# Patient Record
Sex: Male | Born: 1975 | Hispanic: Yes | Smoking: Never smoker
Health system: Southern US, Community
[De-identification: ages and names within clinical notes are randomized; demographics above are authoritative.]

---

## 2013-02-07 ENCOUNTER — Ambulatory Visit: Payer: Self-pay

## 2013-02-07 ENCOUNTER — Ambulatory Visit: Payer: Self-pay | Admitting: Family Medicine

## 2013-02-07 VITALS — BP 114/74 | HR 60 | Temp 98.3°F | Resp 16 | Ht 69.5 in | Wt 185.4 lb

## 2013-02-07 DIAGNOSIS — M549 Dorsalgia, unspecified: Secondary | ICD-10-CM

## 2013-02-07 DIAGNOSIS — M79671 Pain in right foot: Secondary | ICD-10-CM

## 2013-02-07 DIAGNOSIS — M79609 Pain in unspecified limb: Secondary | ICD-10-CM

## 2013-02-07 MED ORDER — CYCLOBENZAPRINE HCL 5 MG PO TABS: ORAL_TABLET | ORAL | Status: DC

## 2013-02-07 MED ORDER — DICLOFENAC SODIUM 75 MG PO TBEC: DELAYED_RELEASE_TABLET | ORAL | Status: DC

## 2013-02-07 NOTE — Progress Notes (Signed)
Subjective: The patient was cutting a tree and the log fell on his right foot about 6 months ago. It is persisted in hurting across the arch of the foot. Initially there is bruising of the entire base of the foot. That has resolved but the pain is continued to bother him. He works doing Aeronautical engineer.  The patient has also had for longtime pain in his upper back across the base of the shoulders.  Objective: Healthy-appearing gentleman in no major acute distress. Stat tenderness across the upper back above the scapula. He also has tenderness in the arch of his right foot. Good pedal pulses. Good motion is normal. No tenderness of the calcaneus.  [facility] reading (PRIMARY) by  Dr. Alwyn Ren Normal foot.  Assessment: Overuse injury upper back strain Arch strain and pain right foot  Plan: X-ray was done Treat with anti-inflammatory and muscle relaxation medication

## 2013-02-07 NOTE — Patient Instructions (Addendum)
Take muscle relaxants (Flexeril) (cyclobenzaprine) one at bedtime  Take diclofenac for pain and inflammation one twice daily with food  Ice foot as needed for pain  Wear shoes inserts for arch supports  If symptoms persist we may need to refer you to an orthopedist or foot doctor for custom-made orthotic shoe insert

## 2013-03-27 ENCOUNTER — Emergency Department (HOSPITAL_COMMUNITY)
Admission: EM | Admit: 2013-03-27 | Discharge: 2013-03-27 | Disposition: A | Discharge status: Home / Self Care | Payer: Worker's Compensation | Attending: Emergency Medicine | Admitting: Emergency Medicine

## 2013-03-27 ENCOUNTER — Encounter (HOSPITAL_COMMUNITY): Payer: Self-pay | Admitting: Emergency Medicine

## 2013-03-27 ENCOUNTER — Emergency Department (HOSPITAL_COMMUNITY): Payer: Worker's Compensation

## 2013-03-27 DIAGNOSIS — Y9289 Other specified places as the place of occurrence of the external cause: Secondary | ICD-10-CM | POA: Insufficient documentation

## 2013-03-27 DIAGNOSIS — S61411A Laceration without foreign body of right hand, initial encounter: Secondary | ICD-10-CM

## 2013-03-27 DIAGNOSIS — Z23 Encounter for immunization: Secondary | ICD-10-CM | POA: Insufficient documentation

## 2013-03-27 DIAGNOSIS — W298XXA Contact with other powered powered hand tools and household machinery, initial encounter: Secondary | ICD-10-CM | POA: Insufficient documentation

## 2013-03-27 DIAGNOSIS — Y9389 Activity, other specified: Secondary | ICD-10-CM | POA: Insufficient documentation

## 2013-03-27 DIAGNOSIS — S61209A Unspecified open wound of unspecified finger without damage to nail, initial encounter: Secondary | ICD-10-CM | POA: Insufficient documentation

## 2013-03-27 DIAGNOSIS — S61409A Unspecified open wound of unspecified hand, initial encounter: Secondary | ICD-10-CM | POA: Insufficient documentation

## 2013-03-27 MED ORDER — CEPHALEXIN 500 MG PO CAPS: 500.0000 mg | ORAL_CAPSULE | Freq: Four times a day (QID) | ORAL | Status: DC

## 2013-03-27 MED ORDER — HYDROCODONE-ACETAMINOPHEN 5-325 MG PO TABS: 2.0000 | ORAL_TABLET | ORAL | Status: DC | PRN

## 2013-03-27 MED ORDER — TETANUS-DIPHTH-ACELL PERTUSSIS 5-2.5-18.5 LF-MCG/0.5 IM SUSP
0.5000 mL | Freq: Once | INTRAMUSCULAR | Status: AC
  Administered 2013-03-27: 0.5 mL via INTRAMUSCULAR
  Filled 2013-03-27: qty 0.5

## 2013-03-27 NOTE — ED Notes (Signed)
Pt in radiology at this time, will be transported to room upon completion

## 2013-03-27 NOTE — ED Notes (Signed)
Pt presents with multiple lacerations to R 3rd finger and 1 to R 5th finger.  Pt reports cutting fingers with chain saw while up in tree, was able to climb down.  Bleeding controlled.

## 2013-03-27 NOTE — ED Provider Notes (Signed)
CSN: [id number]     Arrival date & time 03/27/13  1441 History   First MD Initiated Contact with Patient 03/27/13 1606     No chief complaint on file.  (Consider location/radiation/quality/duration/timing/severity/associated sxs/prior Treatment) The history is provided by the patient and medical records. No language interpreter was used.    Adam Park is a 37 y.o. male  with no known medical history presents to the Emergency Department complaining of acute, persistent, laceration to the right hand onset 3 PM today. Patient reports he was at work in a tree using a chain saw which slipped and cut his right hand. He reports he did not wash it or attempt any treatment prior to arrival. Nothing makes it better or worse. He denies fever, chills. He denies falling or hitting his head. He denies loss of consciousness. Patient reports that he was able to climb down from the tree after the laceration occurred.    History reviewed. No pertinent past medical history. History reviewed. No pertinent past surgical history. Family History  Problem Relation Age of Onset  . Diabetes Mother   . Drug abuse Paternal Grandmother    History  Substance Use Topics  . Smoking status: Never Smoker   . Smokeless tobacco: Not on file  . Alcohol Use: No    Review of Systems  Constitutional: Negative for fever.  Gastrointestinal: Negative for nausea and vomiting.  Skin: Positive for wound.  Allergic/Immunologic: Negative for immunocompromised state.  Neurological: Negative for weakness and numbness.  Hematological: Does not bruise/bleed easily.  Psychiatric/Behavioral: The patient is not nervous/anxious.     Allergies  Review of patient's allergies indicates no known allergies.  Home Medications   Current Outpatient Rx  Name  Route  Sig  Dispense  Refill  . cephALEXin (KEFLEX) 500 MG capsule   Oral   Take 1 capsule (500 mg total) by mouth 4 (four) times daily.   40 capsule   0   .  HYDROcodone-acetaminophen (NORCO/VICODIN) 5-325 MG per tablet   Oral   Take 2 tablets by mouth every 4 (four) hours as needed for pain.   6 tablet   0    BP 134/80  Pulse 82  Temp(Src) 98 F (36.7 C) (Oral)  Resp 20  SpO2 100% Physical Exam  Nursing note and vitals reviewed. Constitutional: He is oriented to person, place, and time. He appears well-developed and well-nourished. No distress.  HENT:  Head: Normocephalic and atraumatic.  Eyes: Conjunctivae are normal. No scleral icterus.  Neck: Normal range of motion.  Cardiovascular: Normal rate, regular rhythm, normal heart sounds and intact distal pulses.   No murmur heard. Pulses:      Radial pulses are 2+ on the right side, and 2+ on the left side.  Capillary refill < 3 sec  Pulmonary/Chest: Effort normal and breath sounds normal. No respiratory distress.  Musculoskeletal: Normal range of motion. He exhibits no edema.  ROM: Full range of motion of all fingers on the right hand  Neurological: He is alert and oriented to person, place, and time.  Sensation: Intact to dull and sharp in all fingers and the right hand Strength: 5 out of 5 in the right hand including resisted flexion and extension of the fingers as well as strong grip strength  Skin: Skin is warm and dry. He is not diaphoretic.  Multiple lacerations to the right middle finger and right pinky finger  Psychiatric: He has a normal mood and affect.    ED Course  LACERATION REPAIR Date/Time: 03/27/2013 6:00 PM Performed by: Dierdre Forth Authorized by: Dierdre Forth Consent: Verbal consent obtained. Risks and benefits: risks, benefits and alternatives were discussed Consent given by: patient Patient understanding: patient states understanding of the procedure being performed Patient consent: the patient's understanding of the procedure matches consent given Procedure consent: procedure consent matches procedure scheduled Relevant documents:  relevant documents present and verified Required items: required blood products, implants, devices, and special equipment available Patient identity confirmed: verbally with patient and arm band Time out: Immediately prior to procedure a "time out" was called to verify the correct patient, procedure, equipment, support staff and site/side marked as required. Body area: upper extremity Location details: right long finger Laceration length: 6 cm Foreign bodies: no foreign bodies Tendon involvement: none Nerve involvement: none Vascular damage: no Anesthesia: digital block Local anesthetic: lidocaine 2% without epinephrine Anesthetic total: 5 ml Patient sedated: no Preparation: Patient was prepped and draped in the usual sterile fashion. Irrigation solution: saline (Iodine scrub solution used) Irrigation method: syringe Amount of cleaning: extensive Debridement: moderate Degree of undermining: none Wound skin closure material used: 5-0 Vicryl rapide. Number of sutures: 9 Technique: simple Approximation: loose Approximation difficulty: complex Dressing: 4x4 sterile gauze Patient tolerance: Patient tolerated the procedure well with no immediate complications. Comments: Multiple laceration repairs of the right long finger all with 5-0 Vicryl rapide:  6 cm, L. shaped laceration with 9 simple interrupted sutures placed 2.5 cm laceration with 2 simple interrupted sutures placed 5 cm U shaped laceration with 6 sutures placed 2 cm V-shaped laceration with 2 sutures placed 2 cm laceration on the distal aspect of the finger with 2 sutures placed  LACERATION REPAIR Date/Time: 03/27/2013 6:02 PM Performed by: Dierdre Forth Authorized by: Dierdre Forth Consent: Verbal consent obtained. Risks and benefits: risks, benefits and alternatives were discussed Consent given by: patient Patient understanding: patient states understanding of the procedure being performed Patient  consent: the patient's understanding of the procedure matches consent given Procedure consent: procedure consent matches procedure scheduled Relevant documents: relevant documents present and verified Site marked: the operative site was marked Imaging studies: imaging studies available Required items: required blood products, implants, devices, and special equipment available Patient identity confirmed: verbally with patient and arm band Time out: Immediately prior to procedure a "time out" was called to verify the correct patient, procedure, equipment, support staff and site/side marked as required. Body area: upper extremity Location details: right small finger Laceration length: 3 cm Foreign bodies: no foreign bodies Tendon involvement: none Nerve involvement: none Vascular damage: no Anesthesia: digital block Local anesthetic: lidocaine 2% without epinephrine Anesthetic total: 5 ml Patient sedated: no Preparation: Patient was prepped and draped in the usual sterile fashion (Iodine scrub solution used). Irrigation solution: saline Irrigation method: syringe Amount of cleaning: extensive Debridement: moderate Wound skin closure material used: 5-0 Vicryl rapide. Number of sutures: 3 Technique: simple Approximation: loose Approximation difficulty: complex Dressing: 4x4 sterile gauze Patient tolerance: Patient tolerated the procedure well with no immediate complications.   (including critical care time) Labs Review Labs Reviewed - No data to display Imaging Review Dg Hand Complete Right  03/27/2013   CLINICAL DATA:  Hand pain and numbness. Cut with a chain sella.  EXAM: RIGHT HAND - COMPLETE 3+ VIEW  FINDINGS: The joint spaces are maintained. No acute fractures identified. No radiopaque foreign body.  IMPRESSION: No acute fracture or radiopaque foreign body.   Electronically Signed   By: Loralie Champagne M.D.   On: 03/27/2013 15:51  EKG Interpretation   None       MDM    1. Laceration of hand, right, complicated, initial encounter     Bartlett Enke presents with multiple jagged lacerations to the middle and little fingers of the right hand from a chain saw accident. Unknown last tetanus.  6:04 PM Greater than one hour spent in suturing the patient's lacerations closed.  Tdap booster given. Pressure irrigation performed and the patient's hand scrubbed with iodine scrub solution prior to debridement and closure. Laceration occurred < 8 hours prior to repair which was well tolerated. Pt has no co morbidities to effect normal wound healing. Patient neurovascularly intact without evidence of injury to any tendons. Discussed suture home care w pt and answered questions. Pt to f-u for wound check and suture removal in 7 days with hand surgery. Pt is hemodynamically stable w no complaints prior to dc.    It has been determined that no acute conditions requiring further emergency intervention are present at this time. The patient/guardian have been advised of the diagnosis and plan. We have discussed signs and symptoms that warrant return to the ED, such as changes or worsening in symptoms.   Vital signs are stable at discharge.   BP 134/80  Pulse 82  Temp(Src) 98 F (36.7 C) (Oral)  Resp 20  SpO2 100%  Patient/guardian has voiced understanding and agreed to follow-up with the PCP or specialist.    I personally performed the services described in this documentation, which was scribed in my presence. The recorded information has been reviewed and is accurate.    Dahlia Client Denzil Bristol, PA-C 03/27/13 2247

## 2013-03-28 NOTE — ED Provider Notes (Signed)
Medical screening examination/treatment/procedure(s) were performed by non-physician practitioner and as supervising physician I was immediately available for consultation/collaboration.  Matylda Fehring L Linnie Mcglocklin, MD 03/28/13 0022 

## 2015-03-07 IMAGING — CR DG HAND COMPLETE 3+V*R*
4 series · 4 of 4 positions shown · non-contrast
Comparison: none

CLINICAL DATA: Hand pain and numbness. Cut with a chain sella.

EXAM:
RIGHT HAND - COMPLETE 3+ VIEW

[x hand pa right (1 of 2)]
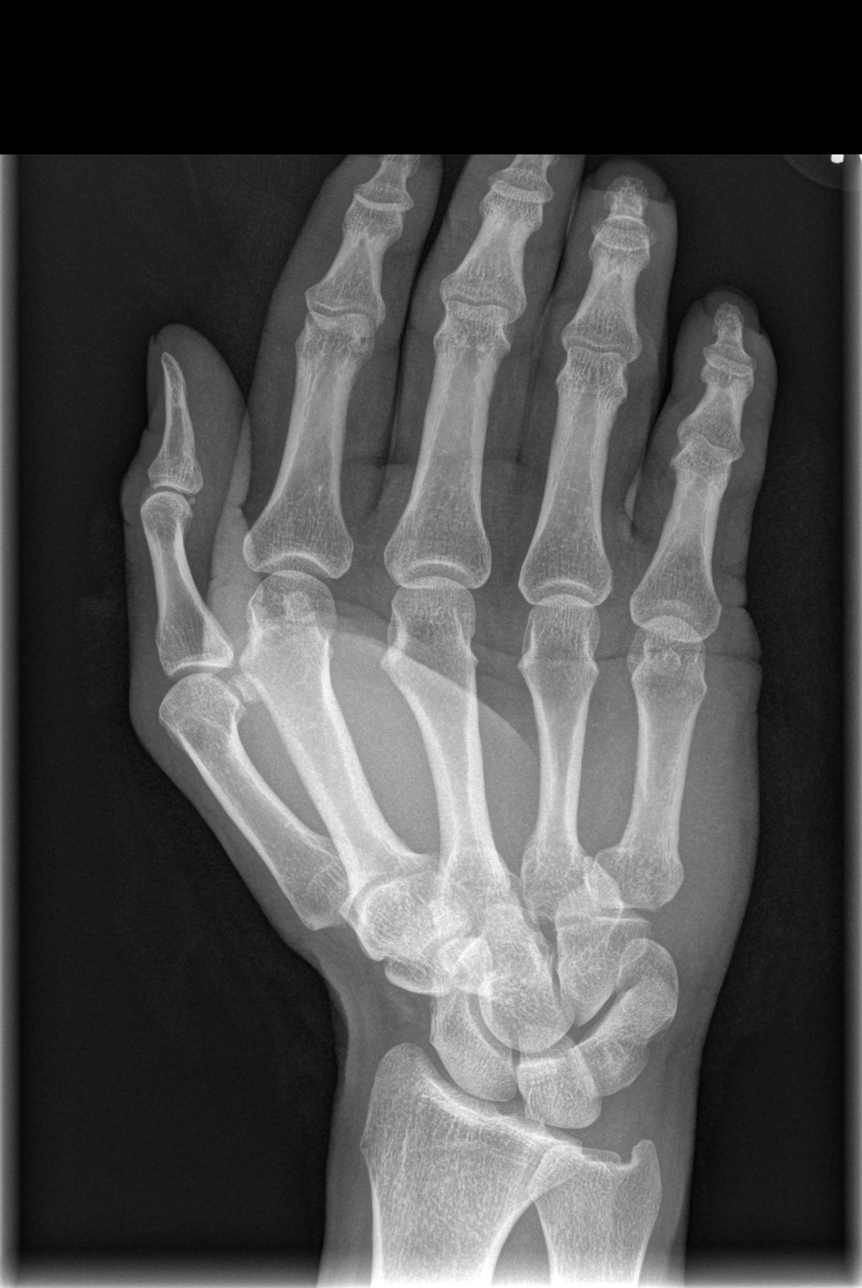

[x hand oblique right]
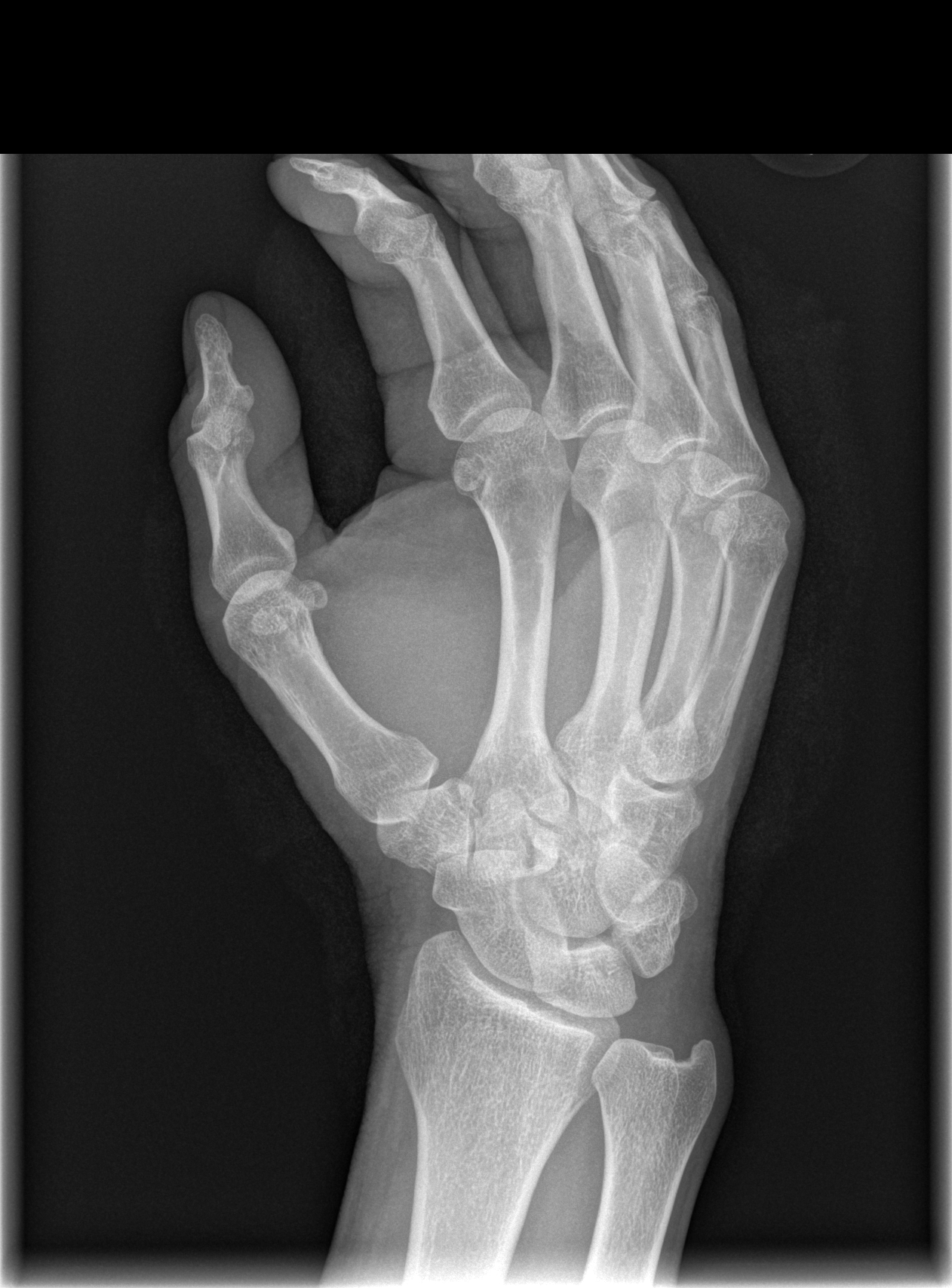

[x hand lat right]
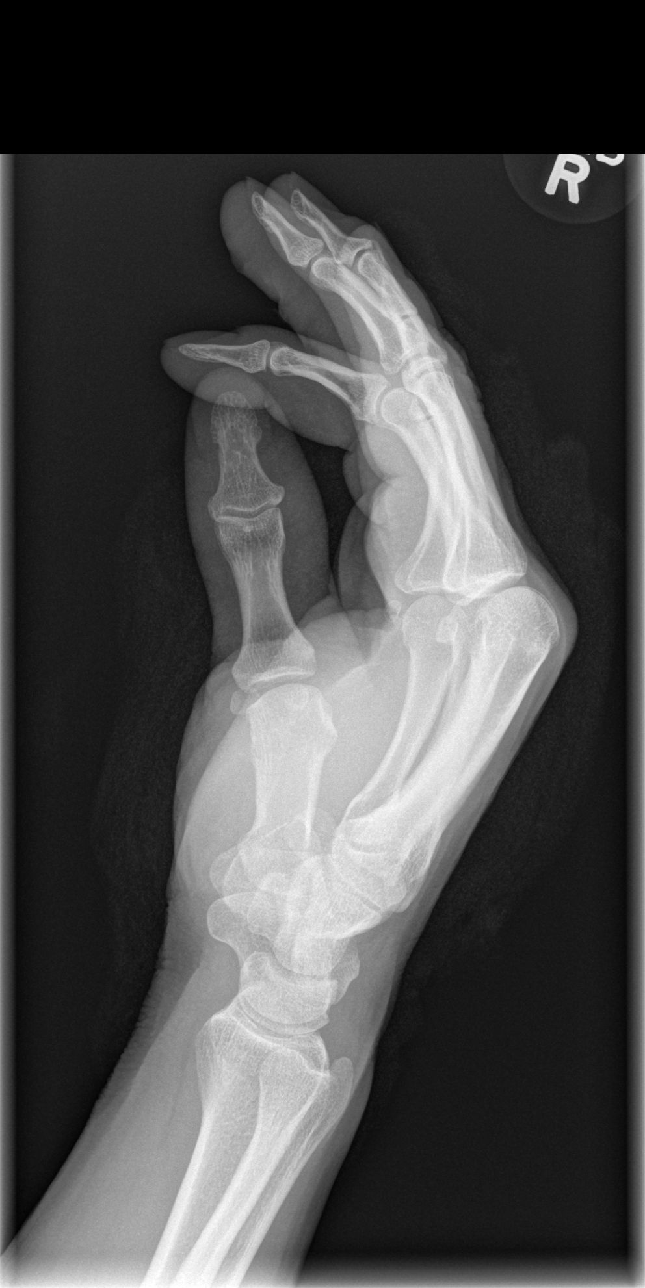

[x hand pa right (2 of 2)]
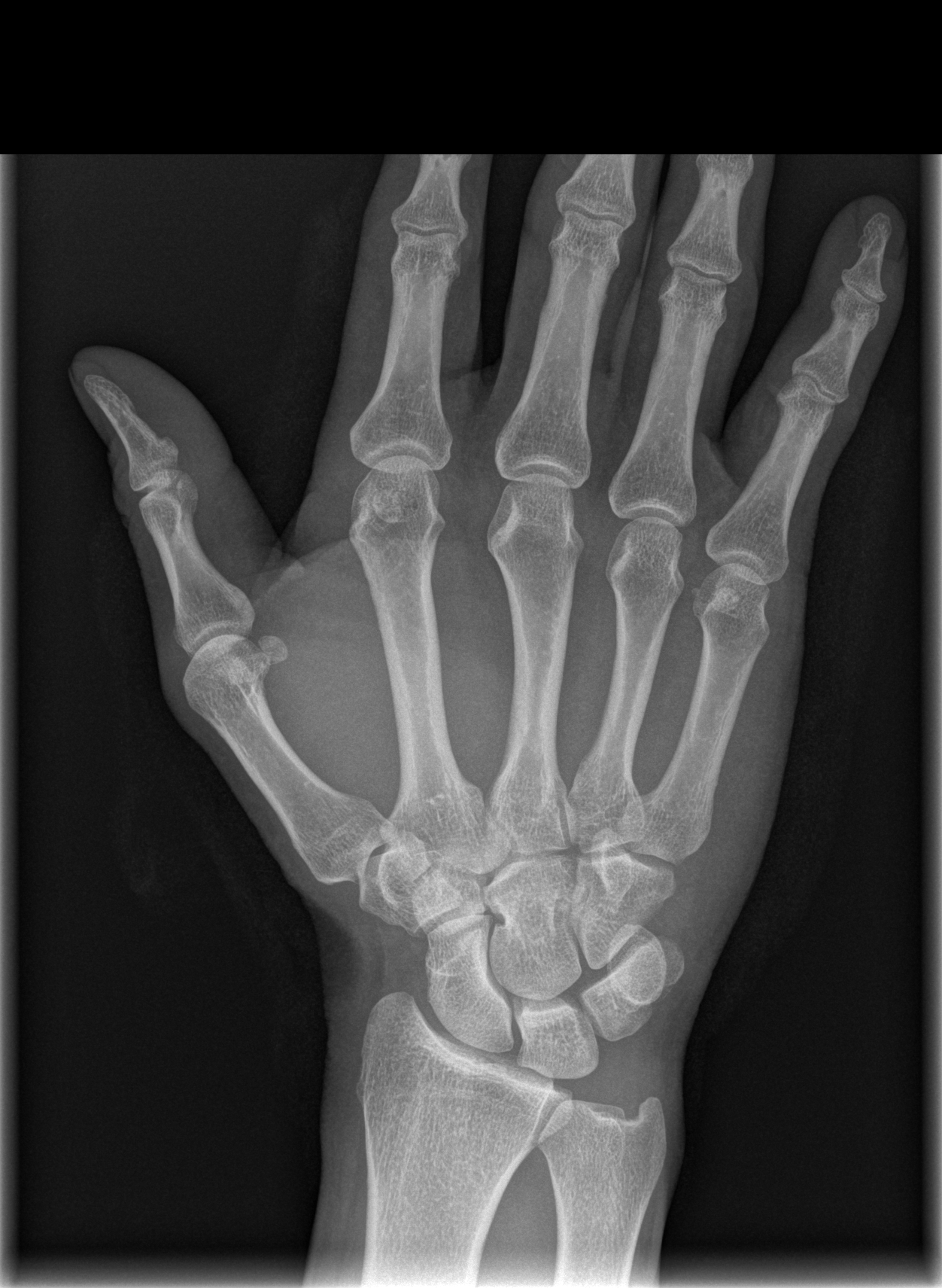

[4 of 4 positions shown; findings below may reference images not displayed]

FINDINGS: The joint spaces are maintained. No acute fractures identified. No
radiopaque foreign body.
IMPRESSION: No acute fracture or radiopaque foreign body.

## 2016-02-08 ENCOUNTER — Ambulatory Visit (INDEPENDENT_AMBULATORY_CARE_PROVIDER_SITE_OTHER): Payer: Self-pay | Admitting: Family Medicine

## 2016-02-08 VITALS — BP 118/80 | HR 53 | Temp 98.2°F | Resp 16 | Ht 71.0 in | Wt 182.0 lb

## 2016-02-08 DIAGNOSIS — K148 Other diseases of tongue: Secondary | ICD-10-CM

## 2016-02-08 DIAGNOSIS — J329 Chronic sinusitis, unspecified: Secondary | ICD-10-CM

## 2016-02-08 DIAGNOSIS — Z0000 Encounter for general adult medical examination without abnormal findings: Secondary | ICD-10-CM

## 2016-02-08 DIAGNOSIS — Z23 Encounter for immunization: Secondary | ICD-10-CM

## 2016-02-08 DIAGNOSIS — R0982 Postnasal drip: Secondary | ICD-10-CM

## 2016-02-08 LAB — COMPLETE METABOLIC PANEL WITH GFR
ALT: 14 U/L (ref 9–46)
AST: 15 U/L (ref 10–40)
Albumin: 4.3 g/dL (ref 3.6–5.1)
Alkaline Phosphatase: 37 U/L — ABNORMAL LOW (ref 40–115)
BUN: 9 mg/dL (ref 7–25)
CO2: 27 mmol/L (ref 20–31)
Calcium: 9.2 mg/dL (ref 8.6–10.3)
Chloride: 104 mmol/L (ref 98–110)
Creat: 0.72 mg/dL (ref 0.60–1.35)
GFR, Est African American: >89 mL/min (ref >=60)
GFR, Est Non African American: >89 mL/min (ref >=60)
Glucose, Bld: 92 mg/dL (ref 65–99)
Potassium: 4.3 mmol/L (ref 3.5–5.3)
Sodium: 139 mmol/L (ref 135–146)
Total Bilirubin: 1.5 mg/dL — ABNORMAL HIGH (ref 0.2–1.2)
Total Protein: 7.1 g/dL (ref 6.1–8.1)

## 2016-02-08 LAB — CBC WITH DIFFERENTIAL/PLATELET
Basophils Absolute: 48 cells/uL (ref 0–200)
Basophils Relative: 1 %
Eosinophils Absolute: 144 cells/uL (ref 15–500)
Eosinophils Relative: 3 %
HCT: 46.1 % (ref 38.5–50.0)
Hemoglobin: 16.1 g/dL (ref 13.2–17.1)
Lymphocytes Relative: 34 %
Lymphs Abs: 1632 cells/uL (ref 850–3900)
MCH: 31.7 pg (ref 27.0–33.0)
MCHC: 34.9 g/dL (ref 32.0–36.0)
MCV: 90.7 fL (ref 80.0–100.0)
MPV: 10.9 fL (ref 7.5–12.5)
Monocytes Absolute: 336 cells/uL (ref 200–950)
Monocytes Relative: 7 %
Neutro Abs: 2640 cells/uL (ref 1500–7800)
Neutrophils Relative %: 55 %
Platelets: 226 10*3/uL (ref 140–400)
RBC: 5.08 MIL/uL (ref 4.20–5.80)
RDW: 12.6 % (ref 11.0–15.0)
WBC: 4.8 10*3/uL (ref 3.8–10.8)

## 2016-02-08 LAB — POCT URINALYSIS DIP (MANUAL ENTRY)
Bilirubin, UA: NEGATIVE
Blood, UA: NEGATIVE
Glucose, UA: NEGATIVE
Ketones, POC UA: NEGATIVE
Leukocytes, UA: NEGATIVE
Nitrite, UA: NEGATIVE
Protein Ur, POC: NEGATIVE
Spec Grav, UA: 1.015
Urobilinogen, UA: 0.2
pH, UA: 7.5

## 2016-02-08 LAB — LIPID PANEL
Cholesterol: 166 mg/dL (ref 125–200)
HDL: 46 mg/dL (ref >=40)
LDL Cholesterol: 100 mg/dL (ref <130)
Total CHOL/HDL Ratio: 3.6 Ratio (ref <=5.0)
Triglycerides: 100 mg/dL (ref <150)
VLDL: 20 mg/dL (ref <30)

## 2016-02-08 LAB — POC MICROSCOPIC URINALYSIS (UMFC): Mucus: ABSENT

## 2016-02-08 NOTE — Progress Notes (Signed)
   Patient ID: Adam Park, male    DOB: [birth year], 40 y.o.   MRN: [id number]  PCP: No primary care provider on file.  Chief Complaint  Patient presents with  . Annual Exam    administive pe    Subjective:   HPI   [phone] Adam EisenmengerFelix- Spanish  40 year old male presents for a physical exam. Patient is a non-smoker, married, father of 4 boys.   He reports overall good health and works Audiological scientistfull-time laborer.  HEENT Denies headaches. Reports recent nasal drainage over the last few days. He thinks he may have some seasonal allergies. No cough, sore throat ,or difficulty swallowing. He reports never having an eye exam and denies recent changes in vision. Reports two lesions under the tongue times 1 week. Non-painful. Never received a dental exam.  Respiratory/Cardiovascular  Denies chest pain, shortness of breath, wheezing, or palpitations. No known history of blood pressure or heart problems.  Abdomen No abdominal pain or pressure.  No problems with urinary or bowel problems. No muscular. No visual.  Eats three meals per day. High in carbohydrates.  Genitourinary    Neurological/Psychiatric   . Social History   Social History  . Marital status: Unknown    Spouse name: N/A  . Number of children: N/A  . Years of education: N/A   Occupational History  . Not on file.   Social History Main Topics  . Smoking status: Never Smoker  . Smokeless tobacco: Not on file  . Alcohol use No  . Drug use: No  . Sexual activity: Yes   Other Topics Concern  . Not on file   Social History Narrative  . No narrative on file    . Family History  Problem Relation Age of Onset  . Diabetes Mother   . Drug abuse Paternal Grandmother     Review of Systems See HPI   There are no active problems to display for this patient.    Prior to Admission medications   Not on File     No Known Allergies     Objective:  Physical Exam     . Vitals:   02/08/16 0839  BP:  118/80  Pulse: (!) 53  Resp: 16  Temp: 98.2 F (36.8 C)     Assessment & Plan:  Interpreter used to explain discharge Adam Park Adam Park   1. Annual physical exam Plan: - COMPLETE METABOLIC PANEL WITH GFR - Lipid panel - CBC with Differential/Platelet - Flu Vaccine QUAD 36+ mos IM - POCT urinalysis dipstick - POCT Microscopic Urinalysis ([facility]) Age appropriate anticipatory guidance provided  2. Post-nasal drainage, likely related to environmental allergens Plan: Take over the counter Cetrizine (Zyrtec 10 mg ) daily as needed until symptoms resolve.   3. Lesion of tongue  Follow with ENT skin under tongue. Or denist  Adam PickKimberly S. Adam PeaHarris, MSN, FNP-C [facility]

## 2016-02-08 NOTE — Patient Instructions (Addendum)
Follow-up for annual exam 1 year.  I will contact you regarding you labs results.  If the tongue lesions increase in size, return for care.   IF you received an x-ray today, you will receive an invoice from [facility]. Please contact [facility] at [phone] with questions or concerns regarding your invoice.   IF you received labwork today, you will receive an invoice from [facility]. Please contact [facility] at [phone] with questions or concerns regarding your invoice.   Our billing staff will not be able to assist you with questions regarding bills from these companies.  You will be contacted with the lab results as soon as they are available. The fastest way to get your results is to activate your My Chart account. Instructions are located on the last page of this paperwork. If you have not heard from us regarding the results in 2 weeks, please contact this office.

## 2016-02-09 ENCOUNTER — Encounter: Payer: Self-pay | Admitting: Family Medicine

## 2016-02-09 NOTE — Progress Notes (Signed)
February 09, 2016   Adam Park [address] [place] [address]   Dear [name],  Below are the results from your recent visit were as expected.  Please return in 12 months for annual physical exam.  Resulted Orders  COMPLETE METABOLIC PANEL WITH GFR  Result Value Ref Range   Sodium 139 135 - 146 mmol/L   Potassium 4.3 3.5 - 5.3 mmol/L   Chloride 104 98 - 110 mmol/L   CO2 27 20 - 31 mmol/L   Glucose, Bld 92 65 - 99 mg/dL   BUN 9 7 - 25 mg/dL   Creat 0.72 0.60 - 1.35 mg/dL   Total Bilirubin 1.5 (H) 0.2 - 1.2 mg/dL   Alkaline Phosphatase 37 (L) 40 - 115 U/L   AST 15 10 - 40 U/L   ALT 14 9 - 46 U/L   Total Protein 7.1 6.1 - 8.1 g/dL   Albumin 4.3 3.6 - 5.1 g/dL   Calcium 9.2 8.6 - 10.3 mg/dL   GFR, Est African American >89 >=60 mL/min   GFR, Est Non African American >89 >=60 mL/min   Narrative   Performed at:  [facility], [address]                [place], [place] [address]  Lipid panel  Result Value Ref Range   Cholesterol 166 125 - 200 mg/dL   Triglycerides 100 <150 mg/dL   HDL 46 >=40 mg/dL   Total CHOL/HDL Ratio 3.6 <=5.0 Ratio   VLDL 20 <30 mg/dL   LDL Cholesterol 100 <130 mg/dL     Comment:       Total Cholesterol/HDL Ratio:CHD Risk                        Coronary Heart Disease Risk Table                                        Men       Women          1/2 Average Risk              3.4        3.3              Average Risk              5.0        4.4           2X Average Risk              9.6        7.1           3X Average Risk             23.4       11.0 Use the calculated Patient Ratio above and the CHD Risk table  to determine the patient's CHD Risk.    Narrative   Performed at:  [address]                [place], [place] [address]  CBC with Differential/Platelet  Result Value Ref Range   WBC 4.8 3.8 -  10.8 K/uL   RBC 5.08 4.20 - 5.80 MIL/uL   Hemoglobin 16.1 13.2 - 17.1 g/dL   HCT 46.1 38.5 - 50.0 %   MCV 90.7 80.0 - 100.0 fL   MCH 31.7 27.0 - 33.0 pg   MCHC 34.9 32.0 - 36.0 g/dL   RDW 12.6 11.0 - 15.0 %   Platelets 226 140 - 400 K/uL   MPV 10.9 7.5 - 12.5 fL   Neutro Abs 2,640 1,500 - 7,800 cells/uL   Lymphs Abs 1,632 850 - 3,900 cells/uL   Monocytes Absolute 336 200 - 950 cells/uL   Eosinophils Absolute 144 15 - 500 cells/uL   Basophils Absolute 48 0 - 200 cells/uL   Neutrophils Relative % 55 %   Lymphocytes Relative 34 %   Monocytes Relative 7 %   Eosinophils Relative 3 %   Basophils Relative 1 %   Smear Review Criteria for review not met    Narrative   Performed at:  [address]                [place], [place] [address]     If you have any questions or concerns, please don't hesitate to call.  Sincerely,   Molli Barrows, FNP

## 2022-08-21 ENCOUNTER — Ambulatory Visit
Admission: EM | Admit: 2022-08-21 | Discharge: 2022-08-21 | Disposition: A | Discharge status: Home / Self Care | Payer: Self-pay | Attending: Physician Assistant | Admitting: Physician Assistant

## 2022-08-21 ENCOUNTER — Ambulatory Visit (INDEPENDENT_AMBULATORY_CARE_PROVIDER_SITE_OTHER): Payer: Self-pay

## 2022-08-21 DIAGNOSIS — M5432 Sciatica, left side: Secondary | ICD-10-CM

## 2022-08-21 DIAGNOSIS — M79662 Pain in left lower leg: Secondary | ICD-10-CM

## 2022-08-21 MED ORDER — DICLOFENAC SODIUM 75 MG PO TBEC: 75.0000 mg | DELAYED_RELEASE_TABLET | Freq: Two times a day (BID) | ORAL | 0 refills | Status: AC

## 2022-08-21 MED ORDER — METHYLPREDNISOLONE ACETATE 80 MG/ML IJ SUSP
80.0000 mg | Freq: Once | INTRAMUSCULAR | Status: AC
  Administered 2022-08-21: 80 mg via INTRAMUSCULAR

## 2022-08-21 MED ORDER — KETOROLAC TROMETHAMINE 30 MG/ML IJ SOLN
30.0000 mg | Freq: Once | INTRAMUSCULAR | Status: AC
  Administered 2022-08-21: 30 mg via INTRAMUSCULAR

## 2022-08-21 MED ORDER — KETOROLAC TROMETHAMINE 60 MG/2ML IM SOLN: 60.0000 mg | Freq: Once | INTRAMUSCULAR | Status: DC

## 2022-08-21 MED ORDER — PREDNISONE 50 MG PO TABS: ORAL_TABLET | ORAL | 0 refills | Status: AC

## 2022-08-21 NOTE — ED Provider Notes (Signed)
[name]    CSN: [id number] Arrival date & time: 08/21/22  [id number]      History   Chief Complaint Chief Complaint  Patient presents with   Leg Pain    HPI Adam Park is a 47 y.o. male.   Pt complains of pain in his left lower leg.  Patient reports he was working 2 days ago and was bending over frequently and began having pain in his left calf muscle.  Patient reports he has also had pain in the left buttock coming down the back of his leg for the past month.  Patient reports both calf muscles hurt after working in bending however left calf muscle is now more painful.  Patient complains of pain when he sits patient complains of pain with walking and bending  The history is provided by the patient. No language interpreter was used.  Leg Pain Location:  Buttock Time since incident:  1 month Injury: yes   Buttock location:  L buttock Pain details:    Quality:  Aching   Radiates to:  Does not radiate   Severity:  Moderate Relieved by:  Nothing Worsened by:  Nothing Ineffective treatments:  None tried Associated symptoms: no decreased ROM     History reviewed. No pertinent past medical history.  There are no problems to display for this patient.   History reviewed. No pertinent surgical history.     Home Medications    Prior to Admission medications   Not on File    Family History Family History  Problem Relation Age of Onset   Diabetes Mother    Drug abuse Paternal Grandmother     Social History Social History   Tobacco Use   Smoking status: Never  Substance Use Topics   Alcohol use: No   Drug use: No     Allergies   Patient has no known allergies.   Review of Systems Review of Systems  All other systems reviewed and are negative.    Physical Exam Triage Vital Signs ED Triage Vitals [08/21/22 1003]  Enc Vitals Group     BP 119/72     Pulse Rate (!) 55     Resp 18     Temp 98 F (36.7 C)     Temp Source Oral      SpO2 98 %     Weight      Height      Head Circumference      Peak Flow      Pain Score 8     Pain Loc      Pain Edu?      Excl. in [place]?    No data found.  Updated Vital Signs BP 119/72 (BP Location: Left Arm)   Pulse (!) 55   Temp 98 F (36.7 C) (Oral)   Resp 18   SpO2 98%   Visual Acuity Right Eye Distance:   Left Eye Distance:   Bilateral Distance:    Right Eye Near:   Left Eye Near:    Bilateral Near:     Physical Exam Vitals and nursing note reviewed.  Constitutional:      Appearance: He is well-developed.  HENT:     Head: Normocephalic.     Mouth/Throat:     Mouth: Mucous membranes are moist.  Cardiovascular:     Rate and Rhythm: Normal rate and regular rhythm.  Pulmonary:     Effort: Pulmonary effort is normal.  Abdominal:  General: There is no distension.  Musculoskeletal:        General: Swelling and tenderness present.     Cervical back: Normal range of motion.     Comments: Tender left lateral knee and swollen calf,  negative homans,  tender sciatic notch.   Neurological:     Mental Status: He is alert and oriented to person, place, and time.      UC Treatments / Results  Labs (all labs ordered are listed, but only abnormal results are displayed) Labs Reviewed - No data to display  EKG   Radiology DG Tibia/Fibula Left  Result Date: 08/21/2022 CLINICAL DATA:  Pain and swelling EXAM: LEFT TIBIA AND FIBULA - 2 VIEW COMPARISON:  None Available. FINDINGS: No fracture or dislocation is seen. There are no opaque foreign bodies. There are no abnormal soft tissue calcifications or pockets of air. IMPRESSION: No radiographic abnormality is seen in bony structures in left tibia and fibula. Electronically Signed   By: Elmer Picker M.D.   On: 08/21/2022 10:39    Procedures Procedures (including critical care time)  Medications Ordered in UC Medications  ketorolac (TORADOL) injection 60 mg (has no administration in time range)   methylPREDNISolone acetate (DEPO-MEDROL) injection 80 mg (has no administration in time range)    Initial Impression / Assessment and Plan / UC Course  I have reviewed the triage vital signs and the nursing notes.  Pertinent labs & imaging results that were available during my care of the patient were reviewed by me and considered in my medical decision making (see chart for details).     MDM: Patient denies any DVT risk calf pain and swelling seems to have been associated with a lifting injury.  Suspect patient has sciatica given pain down the back of his leg.  Is given injection of Medrol and Toradol.  I have advised patient he should schedule to see the orthopedist patient is given information for [name].  Patient given a prescription for prednisone x 5 days and Voltaren Final Clinical Impressions(s) / UC Diagnoses   Final diagnoses:  Sciatica of left side   Discharge Instructions   None    ED Prescriptions     Medication Sig Dispense Auth. Provider   predniSONE (DELTASONE) 50 MG tablet One tablet a day 5 tablet [name] K, PA-C   diclofenac (VOLTAREN) 75 MG EC tablet Take 1 tablet (75 mg total) by mouth 2 (two) times daily. 20 tablet [name], [place]      PDMP not reviewed this encounter. An After Visit Summary was printed and given to the patient.       [name], [place] 08/21/22 1100

## 2022-08-21 NOTE — Discharge Instructions (Addendum)
Return if any problems.

## 2022-08-21 NOTE — ED Triage Notes (Signed)
Pt presents with left leg pain from hip radiating down his leg X 1 month.

## 2022-08-30 ENCOUNTER — Ambulatory Visit (INDEPENDENT_AMBULATORY_CARE_PROVIDER_SITE_OTHER): Payer: Self-pay | Admitting: Sports Medicine

## 2022-08-30 VITALS — BP 124/82 | Ht 72.0 in | Wt 190.0 lb

## 2022-08-30 DIAGNOSIS — M5432 Sciatica, left side: Secondary | ICD-10-CM

## 2022-08-30 MED ORDER — TRAMADOL HCL 50 MG PO TABS: 50.0000 mg | ORAL_TABLET | Freq: Three times a day (TID) | ORAL | 0 refills | Status: AC | PRN

## 2022-08-30 MED ORDER — GABAPENTIN 300 MG PO CAPS: ORAL_CAPSULE | ORAL | 1 refills | Status: AC

## 2022-08-30 NOTE — Progress Notes (Unsigned)
New Patient Office Visit  Subjective   Patient ID: Adam Park, male    DOB: [birth year]  Age: 47 y.o. MRN: [id number]  Back pain.  Interpreter was used for the duration of this visit. Adam Park is here today with chief complaint of pain radiating from his gluteal area down to the back of his heel.  The worst of his pain is in the back of his calf. He reports his pain began about 2 months ago and has been worsening.  He was evaluated at the urgent care on 24 March and was given a shot of Toradol and Depo-Medrol and was sent home with a 5-day course of prednisone and diclofenac.  He reports he got 0 relief from any of these medications.  His pain is worse with prolonged sitting.  He is only able to drive about 6 minutes before his pain is not tolerable.  This pain is keeping him from work.  He denies any acute injury but reports when the pain started he was doing some work kneeling and lifting.  He reports he is getting some sharp pain radiating down his leg with some numbness and tingling but denies any loss of bowel or bladder or weakness in his legs.   ROS as listed above in HPI    Objective:     BP 124/82   Ht 6' (1.829 m)   Wt 190 lb (86.2 kg)   BMI 25.77 kg/m   Physical Exam Vitals reviewed.  Constitutional:      General: He is not in acute distress.    Appearance: Normal appearance. He is not ill-appearing, toxic-appearing or diaphoretic.  Pulmonary:     Effort: Pulmonary effort is normal.  Neurological:     Mental Status: He is alert.   Low back: No obvious deformity or asymmetry.  He does have 1 well-circumscribed area of ecchymosis on his left low back, cupping.  No tenderness to palpation of his midline lumbar spine or paraspinal muscles.  No tenderness to palpation of his SI joint.  Pain was not reproducible to palpation of his buttocks.  Calf was soft, warm and nontender.  He has full range of motion of his leg however reproduction of some discomfort with knee extension.   Positive seated straight leg raise test.  Strength 5/5 resiste ankle plantarflexion and dorsiflexion inversion and eversion.  Great toe extension strength 5/5.  Sensation intact to light touch.  Very subtle bilateral patellar reflexes.  Antalgic gait.     Assessment & Plan:   Problem List Items Addressed This Visit       Nervous and Auditory   Sciatica of left side - Primary    I sent him home today with some stretches and exercises to perform to help with the sciatica he is getting radiating down his left leg.  I have also sent to his pharmacy some tramadol to help with his pain as well as some gabapentin to slowly titrate.  I recommend he call our office after couple weeks if his pain has not improved.  If he experiences worsening or no improvement in his pain with the medications and exercises the neck step would be to order an MRI.  I counseled him that if he develops any worsening of his pain, weakness or loss of bowel or bladder control to seek medical care immediately.  He and his wife verbalized understanding.  We will await his call to see further intervention.      Relevant Medications  gabapentin (NEURONTIN) 300 MG capsule    Return in about 4 weeks (around 09/27/2022).    Elmore Guise, DO  Addendum:  Patient seen in the office by fellow.  Her history, exam, plan of care were precepted with me.  Karlton Lemon MD Adam Park

## 2022-08-30 NOTE — Patient Instructions (Addendum)
For your back pain I want you to begin the stretches and exercises.  I have also sent to your pharmacy some medication, Ultram for pain that you can use up to 3 times a day as needed.  I have also sent to your pharmacy gabapentin to help with the nerve pain shooting down your leg.  I want you to begin with 1 tablet once a day for the first 7 days.  If you tolerate that well then you can increase to 1 tablet twice a day for 7 days and then if you tolerate that increase to 1 tablet 3 times a day for the next 2 weeks.  Call our office if your symptoms worsen or fail to improve over the next 4 weeks and we can order an MRI.  Para tu dolor de espalda quiero que comiences con los estiramientos y ejercicios. Tambin te he enviado a tu Automotive engineer, Ultram para Conservation officer, historic buildings, que puedes utilizar hasta 3 veces al da segn sea necesario. Tambin le he enviado gabapentina a su farmacia para ayudar con el dolor de los nervios que baja por la pierna. Quiero que comiences con 1 tableta una vez al [employer] primeros 7 das. Si lo [employer], puede aumentar a 1 comprimido [employer] al da durante 7 das y [place], si lo [place], Garment/textile technologist a 1 comprimido 3 veces [place] 2 semanas. Llame a nuestro consultorio si sus sntomas empeoran o no mejoran durante las prximas 4 semanas y podremos solicitar una Health visitor.

## 2022-08-30 NOTE — Assessment & Plan Note (Signed)
I sent him home today with some stretches and exercises to perform to help with the sciatica he is getting radiating down his left leg.  I have also sent to his pharmacy some tramadol to help with his pain as well as some gabapentin to slowly titrate.  I recommend he call our office after couple weeks if his pain has not improved.  If he experiences worsening or no improvement in his pain with the medications and exercises the neck step would be to order an MRI.  I counseled him that if he develops any worsening of his pain, weakness or loss of bowel or bladder control to seek medical care immediately.  He and his wife verbalized understanding.  We will await his call to see further intervention.
